# Patient Record
Sex: Female | Born: 1990 | Hispanic: No | Marital: Single | Smoking: Never smoker
Health system: Southern US, Community
[De-identification: ages and names within clinical notes are randomized; demographics above are authoritative.]

## PROBLEM LIST (undated history)

## (undated) DIAGNOSIS — E663 Overweight: Secondary | ICD-10-CM

## (undated) HISTORY — DX: Overweight: E66.3

---
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
## 2013-10-17 ENCOUNTER — Encounter: Payer: Self-pay | Admitting: Pediatrics

## 2013-10-17 ENCOUNTER — Ambulatory Visit (INDEPENDENT_AMBULATORY_CARE_PROVIDER_SITE_OTHER): Payer: No Typology Code available for payment source | Admitting: Pediatrics

## 2013-10-17 VITALS — BP 106/70 | Ht 68.0 in | Wt 189.8 lb

## 2013-10-17 DIAGNOSIS — Z6825 Body mass index (BMI) 25.0-25.9, adult: Secondary | ICD-10-CM

## 2013-10-17 DIAGNOSIS — E663 Overweight: Secondary | ICD-10-CM | POA: Insufficient documentation

## 2013-10-17 DIAGNOSIS — N946 Dysmenorrhea, unspecified: Secondary | ICD-10-CM

## 2013-10-17 DIAGNOSIS — Z0289 Encounter for other administrative examinations: Secondary | ICD-10-CM

## 2013-10-17 DIAGNOSIS — Z00129 Encounter for routine child health examination without abnormal findings: Secondary | ICD-10-CM

## 2013-10-17 NOTE — Progress Notes (Signed)
Routine Well-Adolescent Visit  PCP: Carrie Nan, MD Confirmed?: Yes   History was provided by the patient and mother.  Carrie Gutierrez is a 22 y.o. female who is here for well care. Well know to me from [facility].   Current concerns:   Dysmenorrhea: since menarche menses have been irregular and sometimes light and sometimes heavy. No cramps.   Over weight: The whole family has many lifestyle changes and have lost weight. Carrie Gutierrez has lost the most. Walks the dog 30 minutes a day. Not much screen time. No soda, once a day yogurt, not much fruit or veg. No vitamin, no calcium supplements.  Family Stress: Carrie Gutierrez, 24 yo,  sister is grumpy and mean. This is a change from when Carrie Gutierrez was presenting to the ED for alcohol toxicity and trying to get out of moving care. Carrie Gutierrez has had more than one inpt psych hospitalization in the recent past. The family says Carrie Gutierrez is no longer a problem.    Past Medical History:  No Known Allergies No past medical history on file.  Family history:  No family history on file.  Adolescent Assessment:  Confidentiality was discussed with the patient and if applicable, with caregiver as well.  Home and Environment:  Lives with: lives at home with Mom, Carrie Gutierrez, Carrie Gutierrez, 21, Carrie Gutierrez, Carrie Gutierrez Parental relations: good, now Friends/Peers: has friends, has never had a serious boyfriend-boys are not grown up enough. Sister identifies as gay. [name] does not.   Education and Employment:  School Status: graduated an unable to go to college due to finances and papers. Work: working as a Marketing executive at [employer]. Is proud of her responsibilities Gets sad sometimes, because would like to go to college. Worried about running out of work permits at 29.  With parent out of the room and confidentiality discussed:   Patient reports being comfortable and safe at school and at home? Yes Bullying? No: , bullying others? No:   Drugs: denies Smoking: no Secondhand  smoke exposure? no Drugs/EtOH: denies   Sexuality:  -Menarche: post menarchal, onset around78 or 22 years old - females:  last menses: not sure - Sexually active? no  - sexual partners in last year: 0 - contraception use: no method - Last STI Screening: NA  - Violence/Abuse: denies  Suicide and Depression:  Mood/Suicidality: good, denies Weapons: denies PHQ-9 completed and results indicated low risk  Screenings: The patient completed the Rapid Assessment for Adolescent Preventive Services screening questionnaire and the following topics were identified as risk factors and discussed: healthy eating, exercise and seatbelt use  In addition, the following topics were discussed as part of anticipatory guidance condom use, birth control, sexuality and family problems.   Review of Systems:  Constitutional:   Denies fever  Vision: Denies concerns about vision  HENT: Denies concerns about hearing, snoring  Lungs:   Denies difficulty breathing  Heart:   Denies chest pain  Gastrointestinal:   Denies abdominal pain, constipation, diarrhea  Genitourinary:   Denies dysuria  Neurologic:   Denies headaches      Physical Exam:  BP 106/70  Ht 5\' 8"  (1.727 m)  Wt 189 lb 12.8 oz (86.093 kg)  BMI 28.87 kg/m2  Facility age limit for growth percentiles is 20 years.  General Appearance:   alert, oriented, no acute distress  HENT: Normocephalic, no obvious abnormality, PERRL, EOM's intact, conjunctiva clear  Mouth:   Normal appearing teeth, no obvious discoloration, dental caries, or dental caps  Neck:   Supple;  thyroid: no enlargement, symmetric, no tenderness/mass/nodules  Lungs:   Clear to auscultation bilaterally, normal work of breathing  Heart:   Regular rate and rhythm, S1 and S2 normal, no murmurs;   Abdomen:   Soft, non-tender, no mass, or organomegaly  GU genitalia not examined  Musculoskeletal:   Tone and strength strong and symmetrical, all extremities                Lymphatic:   No cervical adenopathy  Skin/Hair/Nails:   Skin warm, dry and intact, no rashes, no bruises or petechiae  Neurologic:   Strength, gait, and coordination normal and age-appropriate    Assessment/Plan:  1. Routine infant or child health check  - HPV vaccine quadravalent 3 dose IM - Flu vaccine nasal quad (Flumist QUAD Nasal) - Meningococcal polysaccharide vaccine subcutaneous  2. Overweight (BMI 25.0-29.9) Motivated to improve diet and exercise. Intentional 10 lb weight lost. Add calcium, MVI with Fe Congratulations on healthy lifestyle effort for whole family  3. Dysmenorrhea Mostly irregular, would like to use hormonal method to regulate menses, but does not want to start a method today.  Could consider PCOS and discussed that the first thing to do is to maintain weight loss, No insurance, thyroid not done. - Ambulatory referral to Adolescent Medicine Was unable to provide specimen for Chlamydia screening. Denied sexual activity.   Jahred Tatar 10/17/2013

## 2013-10-17 NOTE — Patient Instructions (Signed)
Obesity Obesity is having too much body fat and a body mass index (BMI) of 30 or more. BMI is a number based on your height and weight. The number is an estimate of how much body fat you have. Obesity can happen if you eat more calories than you can burn by exercising or other activity. It can cause major health problems or emergencies.  HOME CARE  Exercise and be active as told by your doctor. Try:  Using stairs when you can.  Parking farther away from store doors.  Gardening, biking, or walking.  Eat healthy foods and drinks that are low in calories. Eat more fruits and vegetables.  Limit fast food, sweets, and snack foods that are made with ingredients that are not natural (processed food).  Eat smaller amounts of food.  Keep a journal and write down what you eat every day. Websites can help with this.  Avoid drinking alcohol. Drink more water and drinks without calories.   Take vitamins and dietary pills (supplements) only as told by your doctor.  Try going to weight-loss support groups or classes to help lessen stress. Dieticians and counselors may also help. GET HELP RIGHT AWAY IF:  You have chest pain or tightness.  You have trouble breathing or feel short of breath.  You feel weak or have loss of feeling (numbness) in your legs.  You feel confused or have trouble talking.  You have sudden changes in your vision. MAKE SURE YOU:  Understand these instructions.  Will watch your condition.  Will get help right away if you are not doing well or get worse. Document Released: 02/26/2012 Document Reviewed: 02/26/2012 [employer] Patient Information 2014 [employer].  

## 2013-10-21 ENCOUNTER — Ambulatory Visit: Payer: Self-pay

## 2013-10-29 ENCOUNTER — Ambulatory Visit: Payer: Self-pay

## 2013-11-04 ENCOUNTER — Ambulatory Visit: Payer: Self-pay

## 2013-11-05 ENCOUNTER — Ambulatory Visit: Payer: No Typology Code available for payment source

## 2013-11-11 ENCOUNTER — Institutional Professional Consult (permissible substitution): Payer: Self-pay | Admitting: Pediatrics

## 2014-03-09 ENCOUNTER — Encounter: Payer: Self-pay | Admitting: Pediatrics

## 2014-04-10 ENCOUNTER — Encounter (HOSPITAL_COMMUNITY): Payer: Self-pay | Admitting: Emergency Medicine

## 2014-04-10 ENCOUNTER — Emergency Department (HOSPITAL_COMMUNITY)
Admission: EM | Admit: 2014-04-10 | Discharge: 2014-04-10 | Disposition: A | Discharge status: Home / Self Care | Payer: Worker's Compensation | Attending: Emergency Medicine | Admitting: Emergency Medicine

## 2014-04-10 ENCOUNTER — Emergency Department (HOSPITAL_COMMUNITY): Payer: Worker's Compensation

## 2014-04-10 DIAGNOSIS — E663 Overweight: Secondary | ICD-10-CM | POA: Insufficient documentation

## 2014-04-10 DIAGNOSIS — Y9289 Other specified places as the place of occurrence of the external cause: Secondary | ICD-10-CM | POA: Insufficient documentation

## 2014-04-10 DIAGNOSIS — M79645 Pain in left finger(s): Secondary | ICD-10-CM

## 2014-04-10 DIAGNOSIS — Y990 Civilian activity done for income or pay: Secondary | ICD-10-CM | POA: Insufficient documentation

## 2014-04-10 DIAGNOSIS — Y9389 Activity, other specified: Secondary | ICD-10-CM | POA: Insufficient documentation

## 2014-04-10 DIAGNOSIS — W268XXA Contact with other sharp object(s), not elsewhere classified, initial encounter: Secondary | ICD-10-CM | POA: Insufficient documentation

## 2014-04-10 DIAGNOSIS — S61209A Unspecified open wound of unspecified finger without damage to nail, initial encounter: Secondary | ICD-10-CM

## 2014-04-10 DIAGNOSIS — Z23 Encounter for immunization: Secondary | ICD-10-CM | POA: Insufficient documentation

## 2014-04-10 MED ORDER — TETANUS-DIPHTH-ACELL PERTUSSIS 5-2.5-18.5 LF-MCG/0.5 IM SUSP
0.5000 mL | Freq: Once | INTRAMUSCULAR | Status: AC
  Administered 2014-04-10: 0.5 mL via INTRAMUSCULAR
  Filled 2014-04-10: qty 0.5

## 2014-04-10 MED ORDER — HYDROCODONE-ACETAMINOPHEN 5-325 MG PO TABS
2.0000 | ORAL_TABLET | Freq: Once | ORAL | Status: AC
  Administered 2014-04-10: 2 via ORAL
  Filled 2014-04-10: qty 2

## 2014-04-10 MED ORDER — CEPHALEXIN 500 MG PO CAPS: 500.0000 mg | ORAL_CAPSULE | Freq: Four times a day (QID) | ORAL | Status: AC

## 2014-04-10 MED ORDER — CEPHALEXIN 500 MG PO CAPS
500.0000 mg | ORAL_CAPSULE | Freq: Once | ORAL | Status: AC
  Administered 2014-04-10: 500 mg via ORAL
  Filled 2014-04-10: qty 1

## 2014-04-10 NOTE — ED Notes (Signed)
Pt states she was cutting chicken when she cut off the left tip of her middle finger. Pt's finger is actively bleeding. Pt did not save the cut off portion of finger.

## 2014-04-10 NOTE — ED Provider Notes (Signed)
Medical screening examination/treatment/procedure(s) were performed by non-physician practitioner and as supervising physician I was immediately available for consultation/collaboration.   EKG Interpretation None       Shon Batonourtney F Zenith Kercheval, MD 04/10/14 2250

## 2014-04-10 NOTE — Discharge Instructions (Signed)
Do not change your dressing until after 8AM tomorrow. Keep your hand elevated with sling to decrease likelihood of re-bleeding. Follow up with hand specialist. Take Keflex as prescribed. Recommend 600mg  ibuprofen every 6 hours for pain control. Return if symptoms worsen or signs of infection develop.  Finger Avulsion  When the tip of the finger is lost, a new nail may grow back if part of the fingernail is left. The new nail may be deformed. If just the tip of the finger is lost, no repair may be needed unless there is bone showing. If bone is showing, your caregiver may need to remove the protruding bone and put on a bandage. Your caregiver will do what is best for you. Most of the time when a fingertip is lost, the end will gradually grow back on and look fairly normal, but it may remain sensitive to pressure and temperature extremes for a long time. HOME CARE INSTRUCTIONS   Keep your hand elevated above your heart to relieve pain and swelling.  Keep your dressing dry and clean.  Change your bandage in 24 hours or as directed.  Only take over-the-counter or prescription medicines for pain, discomfort, or fever as directed by your caregiver.  See your caregiver as needed for problems. SEEK MEDICAL CARE IF:   You have increased pain, swelling, drainage, or bleeding.  You have a fever.  You have swelling that spreads from your finger and into your hand. Make sure to check to see if you need a tetanus booster. Document Released: 02/12/2002 Document Revised: 06/04/2012 Document Reviewed: 01/07/2009 [facility] Patient Information 2014 [employer], [place].

## 2014-04-10 NOTE — ED Provider Notes (Signed)
CSN: [id number]     Arrival date & time 04/10/14  1946 History  This chart was scribed for non-physician practitioner, Antony MaduraKelly Grady Lucci, PA-C working with Shon Batonourtney F Horton, MD by Luisa DagoPriscilla Tutu, ED scribe. This patient was seen in room WTR9/WTR9 and the patient's care was started at 8:47 PM.    No chief complaint on file.  The history is provided by the patient. No language interpreter was used.   HPI Comments: Carrie MassedJulia Gutierrez is a 23 y.o. female who presents to the Emergency Department complaining of a left finger laceration that occurred about 1 hour ago. Pt states that she was cutting chicken at work when she cut off the left tip of her middle finger. Bleeding is currently controlled. Pt does not recall her last tetanus vaccine. Denies any numbness, weakness, pallor. Patient R hand dominant.   Past Medical History  Diagnosis Date  . Overweight 2008   History reviewed. No pertinent past surgical history. No family history on file. History  Substance Use Topics  . Smoking status: Never Smoker   . Smokeless tobacco: Never Used  . Alcohol Use: No   OB History   Grav Para Term Preterm Abortions TAB SAB Ect Mult Living                  Review of Systems  Musculoskeletal: Positive for myalgias. Negative for joint swelling.  Skin: Positive for wound (hand laceration). Negative for pallor.  Neurological: Negative for weakness and numbness.  All other systems reviewed and are negative.    Allergies  Review of patient's allergies indicates no known allergies.  Home Medications   Prior to Admission medications   Not on File   BP 141/83  Temp(Src) 98.5 F (36.9 C) (Oral)  Resp 18  Physical Exam  Nursing note and vitals reviewed. Constitutional: She is oriented to person, place, and time. She appears well-developed and well-nourished. No distress.  HENT:  Head: Normocephalic and atraumatic.  Eyes: Conjunctivae and EOM are normal. No scleral icterus.  Neck: Normal range of  motion. Neck supple.  Cardiovascular: Normal rate, regular rhythm and intact distal pulses.   Distal radial pulse 2+ in LUE. Capillary refill normal in all digits of L hand.  Pulmonary/Chest: Effort normal and breath sounds normal. No respiratory distress.  Musculoskeletal: Normal range of motion. She exhibits tenderness.  TTP to distal tip of L middle finger. Patient with avulstion of fat pad L middle finger. Bleeding controlled after application of Thrombi-pad.  Neurological: She is alert and oriented to person, place, and time.  GCS 15. Speech is goal oriented. Moves extremities without ataxia. Sensation to light touch intact at distal tip of affected finger.  Skin: Skin is warm and dry. No rash noted. She is not diaphoretic. No erythema. No pallor.  Psychiatric: She has a normal mood and affect. Her behavior is normal.    ED Course  Procedures (including critical care time)  DIAGNOSTIC STUDIES: Oxygen Saturation was not obtained by my interpretation.    COORDINATION OF CARE: 8:51 PM- Advised pt to see a hand specialist to ensure that the finger heals well. Will prescribe antibiotics. Pt advised of plan for treatment and pt agrees.  Labs Review Labs Reviewed - No data to display  Imaging Review Dg Finger Middle Left  04/10/2014   CLINICAL DATA:  Injury to finger tip.  EXAM: LEFT MIDDLE FINGER 2+V  COMPARISON:  None.  FINDINGS: Soft tissue defect is present involving the tip of the long finger consistent with  provided history. There is a 2 mm curvilinear calcific density projecting just distal to the tuft of the distal phalanx along its ulnar aspect. A definite donor site is not clearly identified. The bones otherwise appear intact. There is no dislocation.  IMPRESSION: Soft tissue injury to the tip of the long finger. 2 mm density adjacent to the tuft of the distal phalanx may reflect minimally displaced fracture, however a donor site is not clearly identified. Small foreign body may be  an additional consideration.   Electronically Signed   By: Sebastian AcheAllen  Grady   On: 04/10/2014 20:44     EKG Interpretation None      MDM   Final diagnoses:  Fingertip avulsion  Pain of finger of left hand    23 year old female presents for a fashion to the distal tip of her left third finger. Patient R hand dominant. Injury sustained prior to arrival cutting chicken. Patient neurovascularly intact on exam. No gross sensory deficits appreciated. X-ray shows potential 2 mm tuft fracture of the distal phalanx; however, no donor site clearly identified. Place patient on Keflex prophylactically. Bleeding controlled in ED with Thrombi-pad. Tdap updated. Patient given sling to assist in elevation. She is stable for d/c with hand specialist referral for f/u. Return precautions provided and patient agreeable to plan with no unaddressed concerns.  I personally performed the services described in this documentation, which was scribed in my presence. The recorded information has been reviewed and is accurate.    Antony MaduraKelly Mikael Skoda, PA-C 04/10/14 2138

## 2014-11-22 IMAGING — CR DG FINGER MIDDLE 2+V*L*
3 series · 3 of 3 positions shown · non-contrast
Comparison: None.

CLINICAL DATA: Injury to finger tip.

EXAM:
LEFT MIDDLE FINGER 2+V

[x finger pa left]
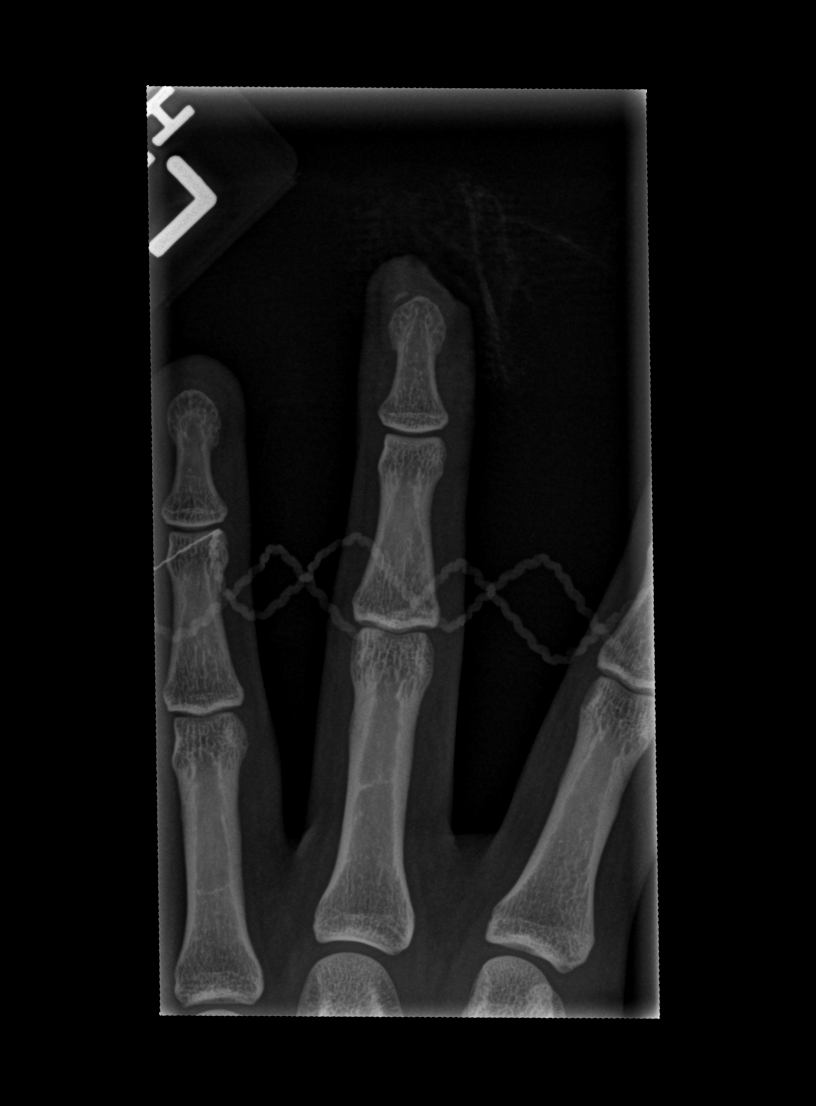

[x finger obl left]
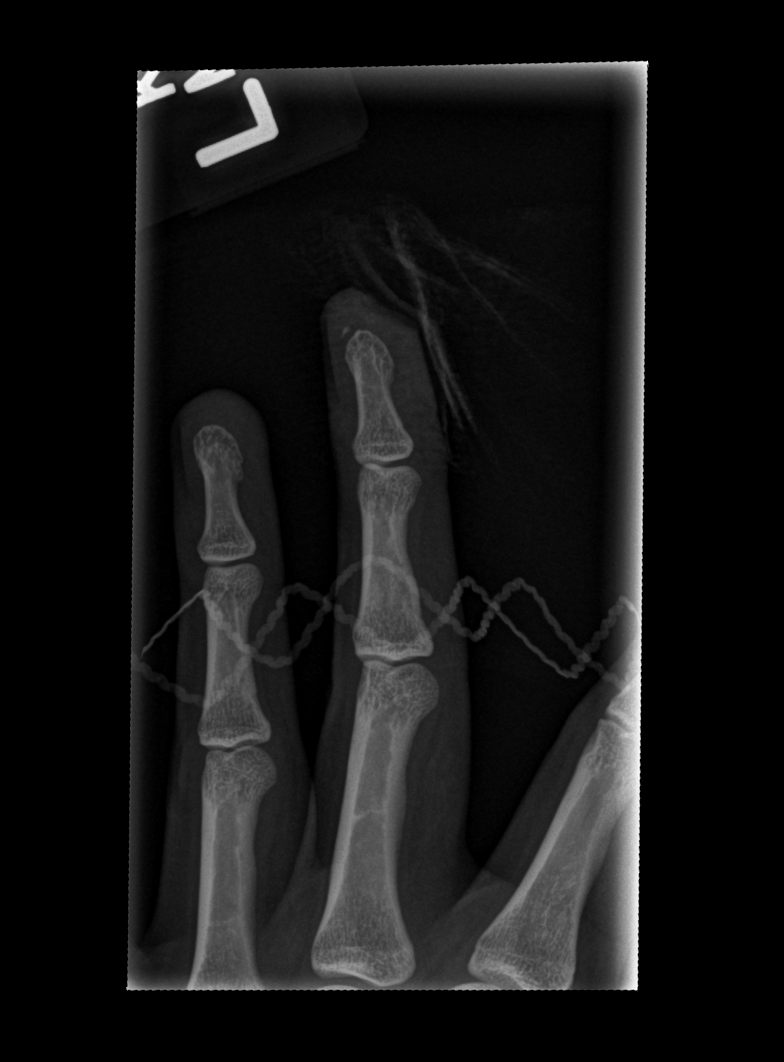

[x finger lat left]
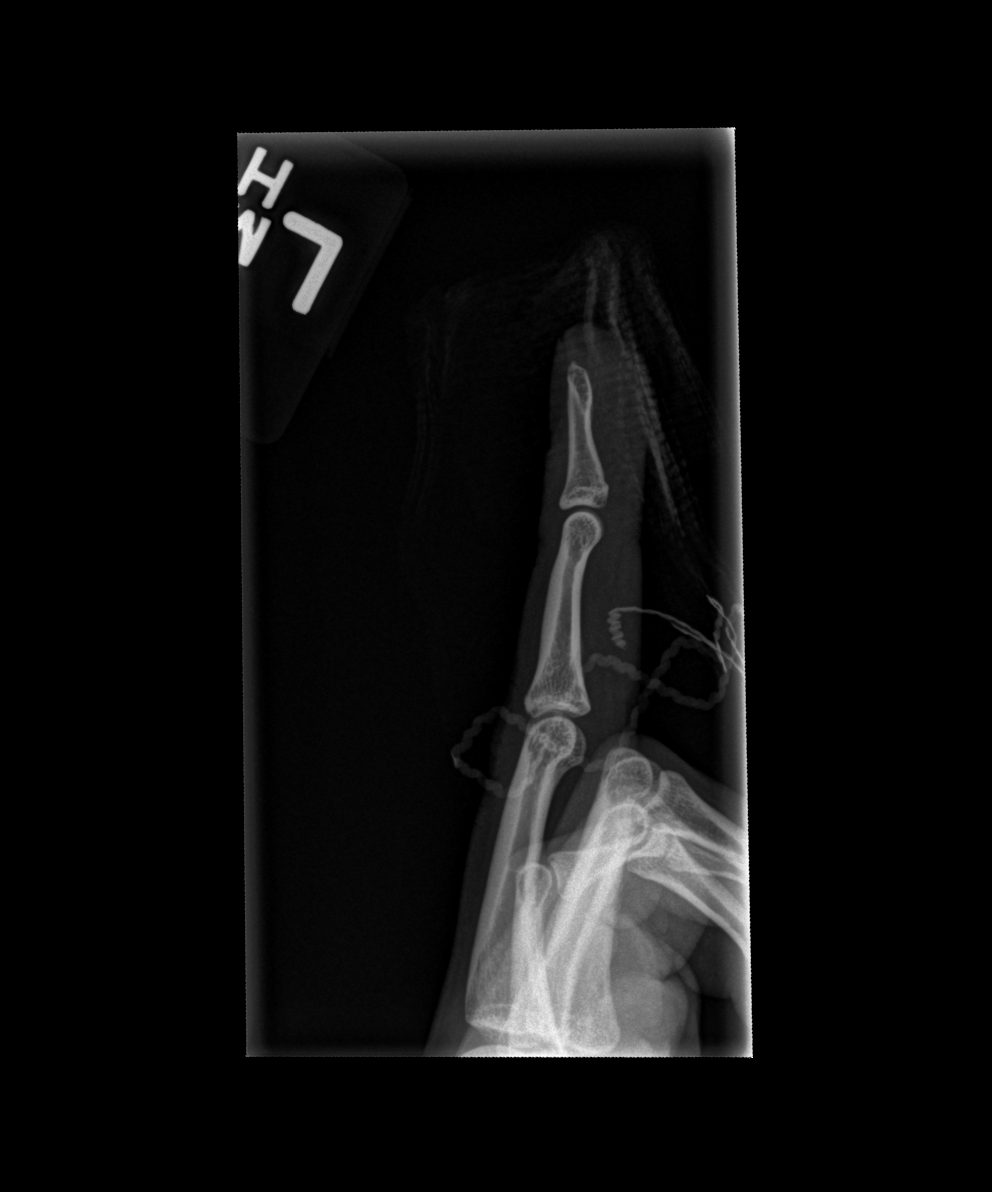

[3 of 3 positions shown; findings below may reference images not displayed]

FINDINGS: Soft tissue defect is present involving the tip of the long finger
consistent with provided history. There is a 2 mm curvilinear
calcific density projecting just distal to the tuft of the distal
phalanx along its ulnar aspect. A definite donor site is not clearly
identified. The bones otherwise appear intact. There is no
dislocation.
IMPRESSION: Soft tissue injury to the tip of the long finger. 2 mm density
adjacent to the tuft of the distal phalanx may reflect minimally
displaced fracture, however a donor site is not clearly identified.
Small foreign body may be an additional consideration.
